# Patient Record
Sex: Male | Born: 1975 | Race: Black or African American | Hispanic: No | Marital: Single
Health system: Southern US, Community
[De-identification: ages and names within clinical notes are randomized; demographics above are authoritative.]

---

## 2009-05-02 ENCOUNTER — Inpatient Hospital Stay (HOSPITAL_COMMUNITY): Admission: EM | Admit: 2009-05-02 | Discharge: 2009-05-04 | Payer: Self-pay | Admitting: Emergency Medicine

## 2009-05-02 ENCOUNTER — Encounter: Payer: Self-pay | Admitting: Emergency Medicine

## 2009-05-04 ENCOUNTER — Emergency Department (HOSPITAL_COMMUNITY): Admission: EM | Admit: 2009-05-04 | Discharge: 2009-05-04 | Payer: Self-pay | Admitting: Emergency Medicine

## 2009-05-05 ENCOUNTER — Ambulatory Visit: Payer: Self-pay | Admitting: Vascular Surgery

## 2009-05-07 ENCOUNTER — Emergency Department (HOSPITAL_COMMUNITY): Admission: EM | Admit: 2009-05-07 | Discharge: 2009-05-07 | Payer: Self-pay | Admitting: Emergency Medicine

## 2009-05-16 ENCOUNTER — Emergency Department (HOSPITAL_COMMUNITY): Admission: EM | Admit: 2009-05-16 | Discharge: 2009-05-16 | Payer: Self-pay | Admitting: Emergency Medicine

## 2009-05-25 ENCOUNTER — Emergency Department (HOSPITAL_COMMUNITY): Admission: EM | Admit: 2009-05-25 | Discharge: 2009-05-25 | Payer: Self-pay | Admitting: Emergency Medicine

## 2009-06-08 ENCOUNTER — Encounter: Admission: RE | Admit: 2009-06-08 | Discharge: 2009-09-06 | Payer: Self-pay | Admitting: Orthopedic Surgery

## 2009-08-23 ENCOUNTER — Emergency Department (HOSPITAL_COMMUNITY): Admission: EM | Admit: 2009-08-23 | Discharge: 2009-08-23 | Payer: Self-pay | Admitting: Emergency Medicine

## 2009-09-13 ENCOUNTER — Emergency Department (HOSPITAL_COMMUNITY): Admission: EM | Admit: 2009-09-13 | Discharge: 2009-09-13 | Payer: Self-pay | Admitting: Emergency Medicine

## 2009-09-19 ENCOUNTER — Emergency Department (HOSPITAL_COMMUNITY): Admission: EM | Admit: 2009-09-19 | Discharge: 2009-09-19 | Payer: Self-pay | Admitting: Emergency Medicine

## 2009-09-25 ENCOUNTER — Emergency Department (HOSPITAL_COMMUNITY): Admission: EM | Admit: 2009-09-25 | Discharge: 2009-09-25 | Payer: Self-pay | Admitting: Emergency Medicine

## 2009-10-12 ENCOUNTER — Emergency Department (HOSPITAL_COMMUNITY): Admission: EM | Admit: 2009-10-12 | Discharge: 2009-10-12 | Payer: Self-pay | Admitting: Emergency Medicine

## 2009-11-13 ENCOUNTER — Emergency Department (HOSPITAL_COMMUNITY): Admission: EM | Admit: 2009-11-13 | Discharge: 2009-11-13 | Payer: Self-pay | Admitting: Emergency Medicine

## 2010-10-19 IMAGING — CR DG ABDOMEN 1V
1 series · 1 of 1 positions shown · non-contrast
Comparison: CT abdomen pelvis 09/13/2009. Chest radiograph
09/13/2009.

CLINICAL DATA: 33-year-old male with flank pain and history of
recent kidney stone.

ABDOMEN - 1 VIEW

[t abdomen supine]
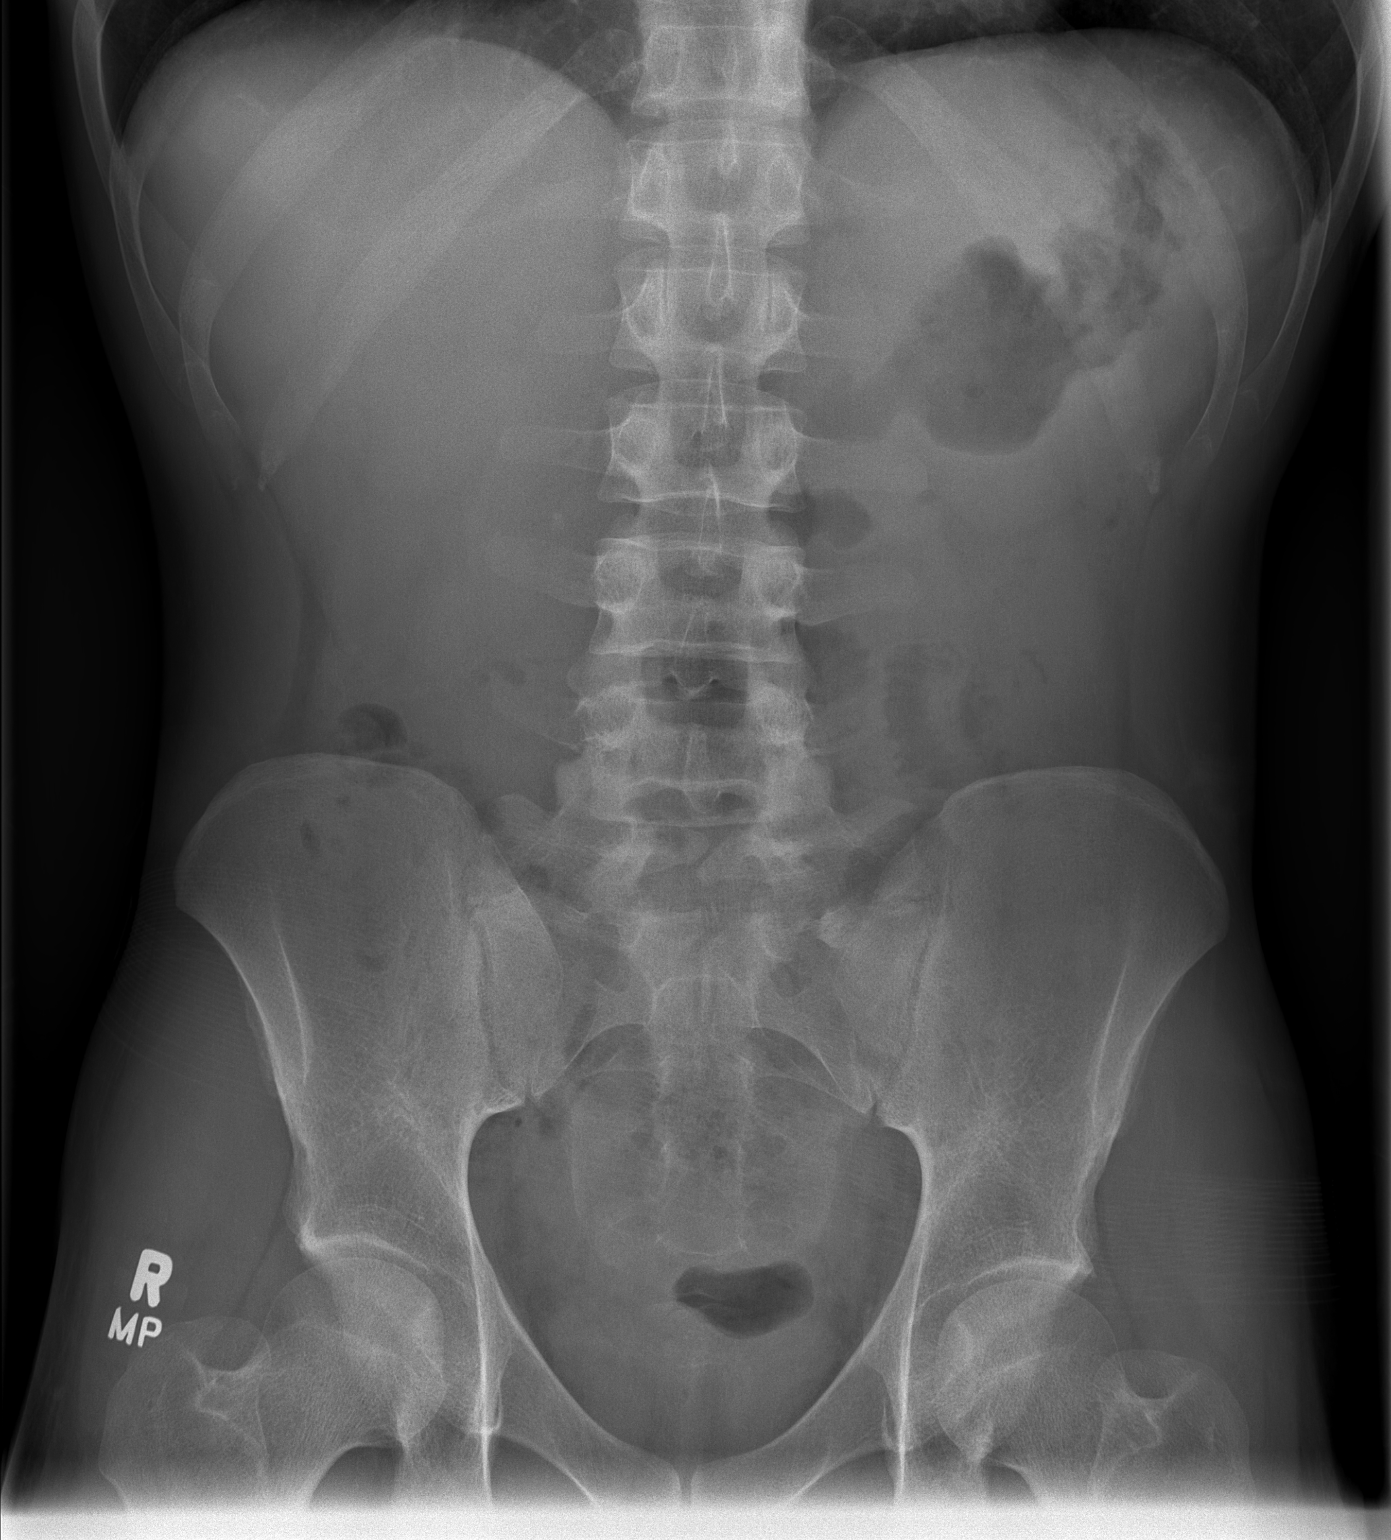

[1 of 1 positions shown; findings below may reference images not displayed]

FINDINGS: Hypoplastic twelfth ribs and partial sacralization of the
L5 vertebra. Incidental L5 spina bifida occulta. Stable visualized
osseous structures.  Visualized bowel gas pattern is nonobstructed.
Visceral contours within normal limits.

Unchanged position of the obstructing proximal right ureteral
calculus, projecting just above the right L3 transverse process.
IMPRESSION: Unchanged position of the previously seen proximal right ureteral
calculus, projecting at L2-L3.

## 2010-12-02 LAB — URINALYSIS, ROUTINE W REFLEX MICROSCOPIC
Bilirubin Urine: NEGATIVE
Bilirubin Urine: NEGATIVE
Glucose, UA: NEGATIVE mg/dL
Glucose, UA: NEGATIVE mg/dL
Hgb urine dipstick: NEGATIVE
Ketones, ur: NEGATIVE mg/dL
Leukocytes, UA: NEGATIVE
Nitrite: NEGATIVE
Nitrite: NEGATIVE
Protein, ur: NEGATIVE mg/dL
Specific Gravity, Urine: 1.005 (ref 1.005–1.030)
Specific Gravity, Urine: 1.016 (ref 1.005–1.030)
Urobilinogen, UA: 0.2 mg/dL (ref 0.0–1.0)
pH: 7 (ref 5.0–8.0)
pH: 7 (ref 5.0–8.0)

## 2010-12-02 LAB — BASIC METABOLIC PANEL WITH GFR
CO2: 28 meq/L (ref 19–32)
Calcium: 9.1 mg/dL (ref 8.4–10.5)
Creatinine, Ser: 0.91 mg/dL (ref 0.4–1.5)
GFR calc non Af Amer: >60 mL/min (ref >60)
Sodium: 142 meq/L (ref 135–145)

## 2010-12-02 LAB — BASIC METABOLIC PANEL
BUN: 10 mg/dL (ref 6–23)
Chloride: 108 mEq/L (ref 96–112)
GFR calc Af Amer: >60 mL/min (ref >60)
Glucose, Bld: 72 mg/dL (ref 70–99)
Potassium: 4 mEq/L (ref 3.5–5.1)

## 2010-12-02 LAB — URINE MICROSCOPIC-ADD ON

## 2010-12-05 LAB — DIFFERENTIAL
Lymphocytes Relative: 31 % (ref 12–46)
Lymphs Abs: 2.2 10*3/uL (ref 0.7–4.0)
Monocytes Absolute: 0.8 10*3/uL (ref 0.1–1.0)
Monocytes Relative: 11 % (ref 3–12)
Neutro Abs: 3.8 10*3/uL (ref 1.7–7.7)

## 2010-12-05 LAB — CBC
Hemoglobin: 13.3 g/dL (ref 13.0–17.0)
RDW: 13 % (ref 11.5–15.5)

## 2010-12-05 LAB — URINALYSIS, ROUTINE W REFLEX MICROSCOPIC
Bilirubin Urine: NEGATIVE
Glucose, UA: NEGATIVE mg/dL
Protein, ur: NEGATIVE mg/dL

## 2010-12-05 LAB — BASIC METABOLIC PANEL
BUN: 13 mg/dL (ref 6–23)
Calcium: 9.3 mg/dL (ref 8.4–10.5)
Creatinine, Ser: 1.32 mg/dL (ref 0.4–1.5)
GFR calc Af Amer: >60 mL/min (ref >60)
GFR calc non Af Amer: >60 mL/min (ref >60)

## 2010-12-13 IMAGING — CT CT ABD-PELV W/O CM
1 of 3 series · 16 of 32 positions shown, 20 images · non-contrast
Comparison: 09/13/2009

CLINICAL DATA: Right flank pain

CT ABDOMEN AND PELVIS WITHOUT CONTRAST
TECHNIQUE: Multidetector CT imaging of the abdomen and pelvis was
performed following the standard protocol without intravenous
contrast.

[Series 2: stone under 200# w/ prev · axial · 0.65mm/px · z∈[-449,-89]mm · 16 of 78 slices shown, 20 images]
[im 3/78  soft-tissue]
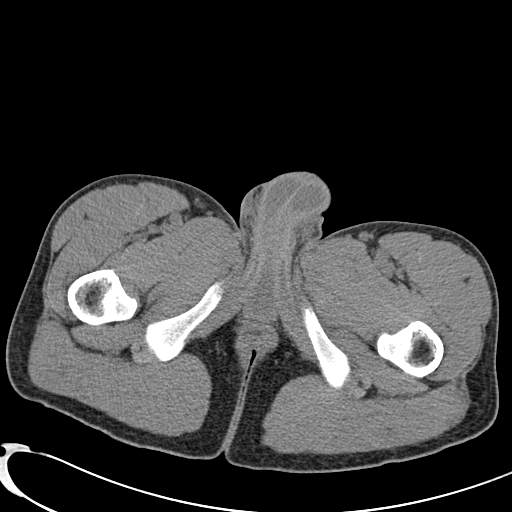
[im 3/78  bone]
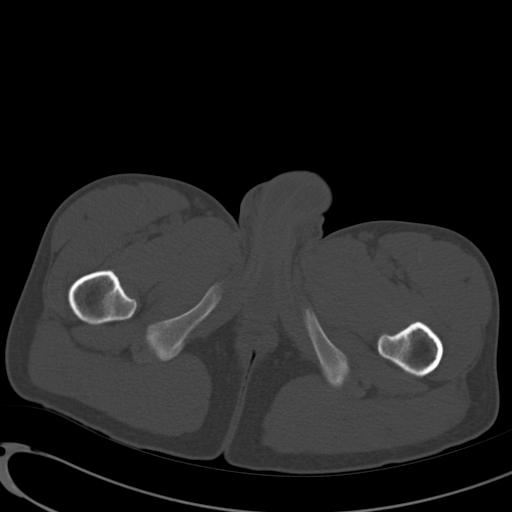
[im 9/78  soft-tissue]
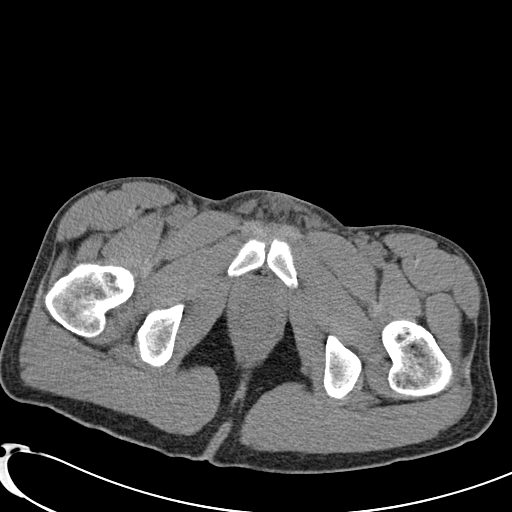
[im 15/78  soft-tissue]
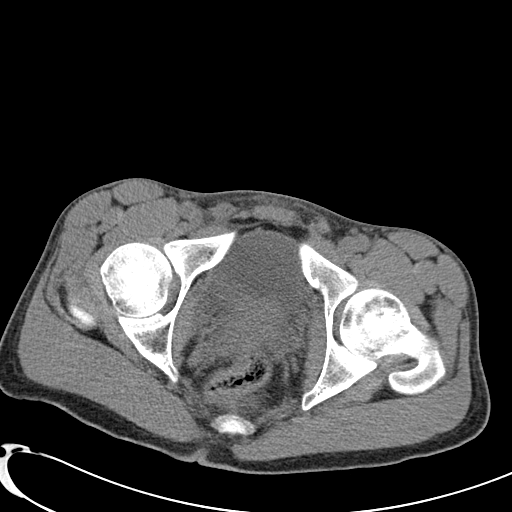
[im 21/78  soft-tissue]
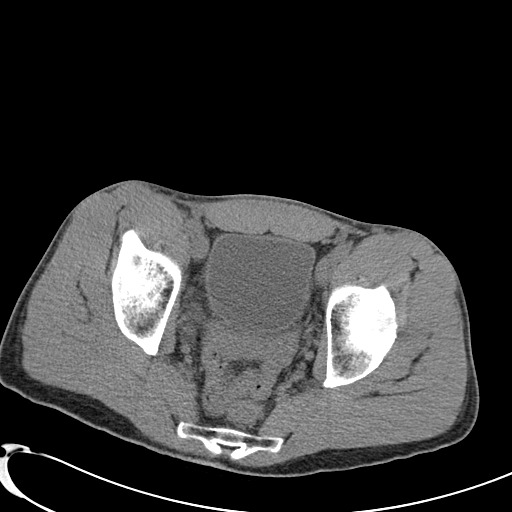
[im 27/78  soft-tissue]
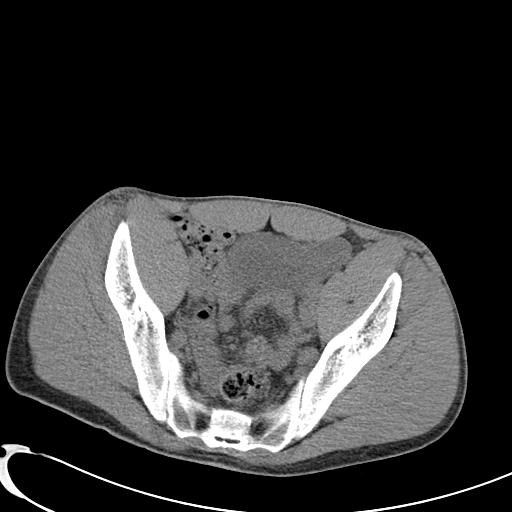
[im 30/78  soft-tissue]
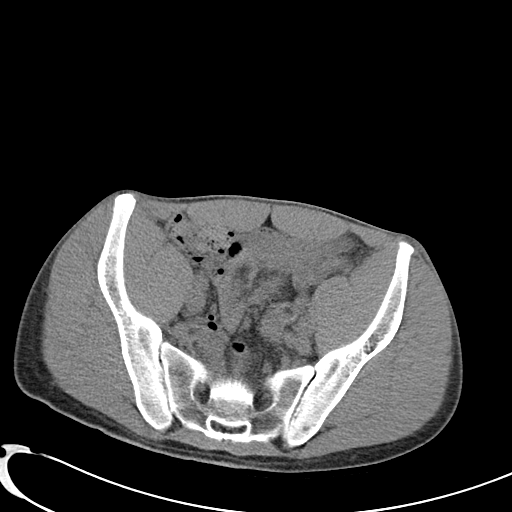
[im 36/78  soft-tissue]
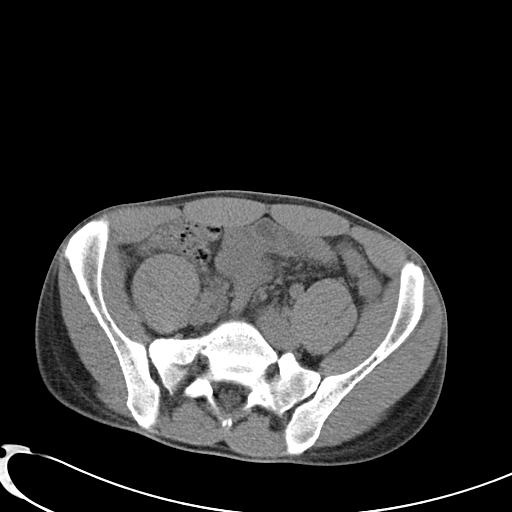
[im 42/78  soft-tissue]
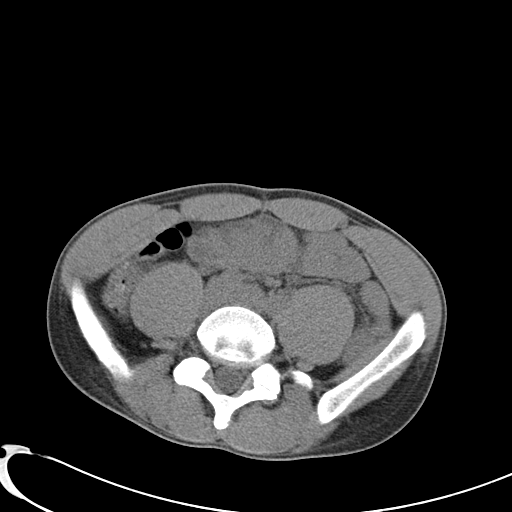
[im 48/78  soft-tissue]
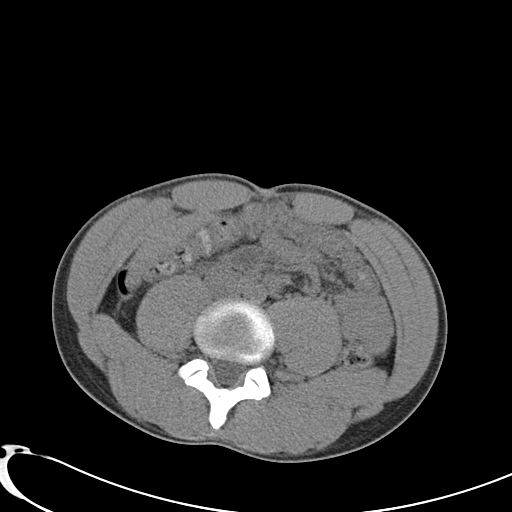
[im 48/78  bone]
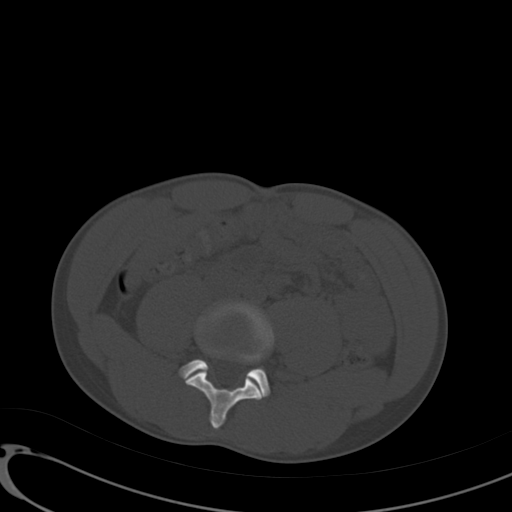
[im 51/78  soft-tissue]
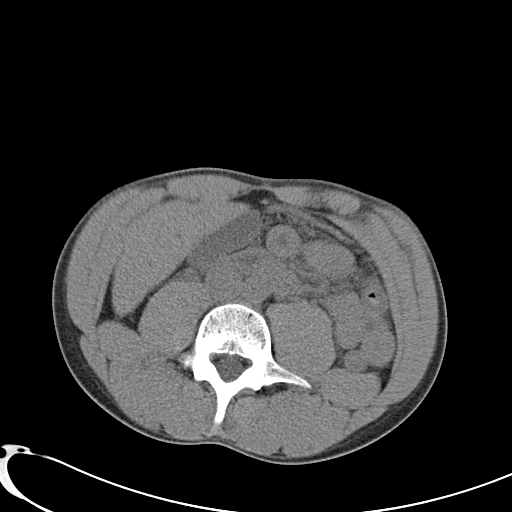
[im 57/78  soft-tissue]
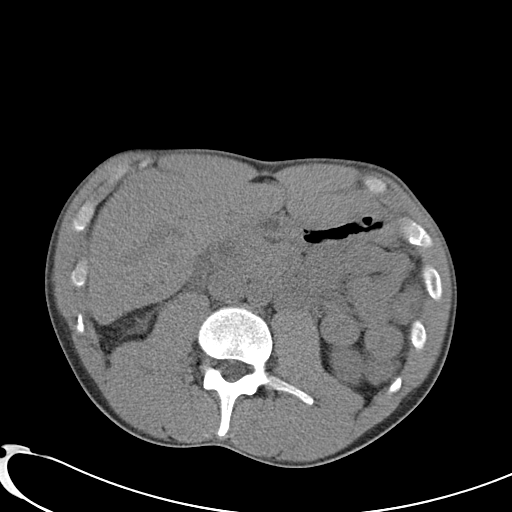
[im 63/78  soft-tissue]
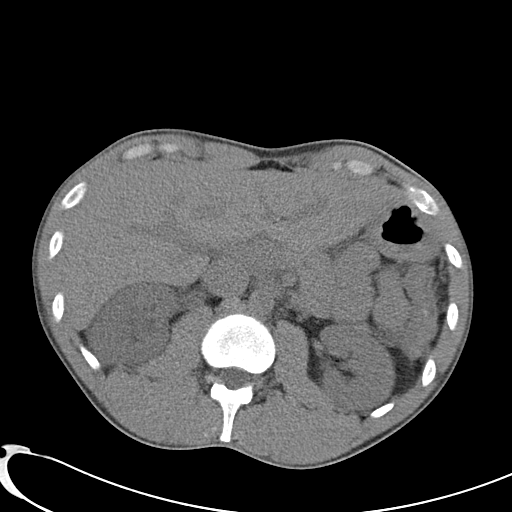
[im 66/78  lung]
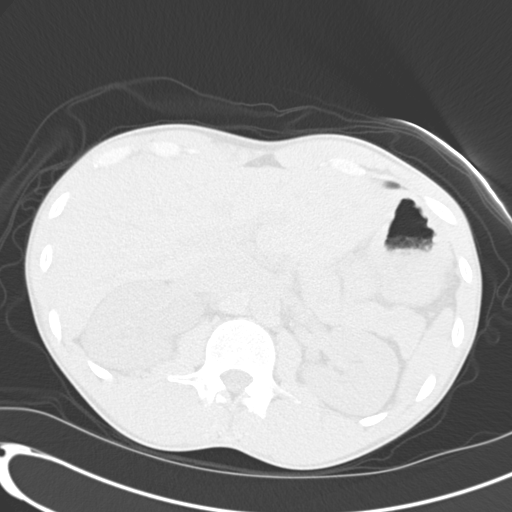
[im 69/78  soft-tissue]
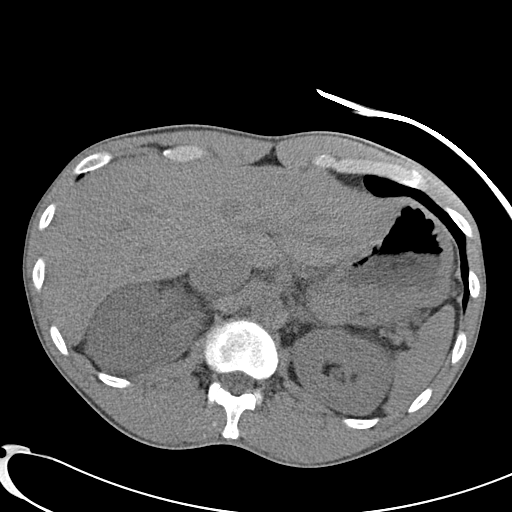
[im 69/78  lung]
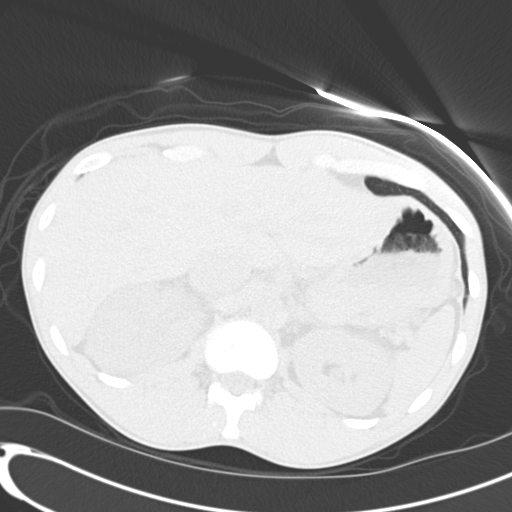
[im 72/78  lung]
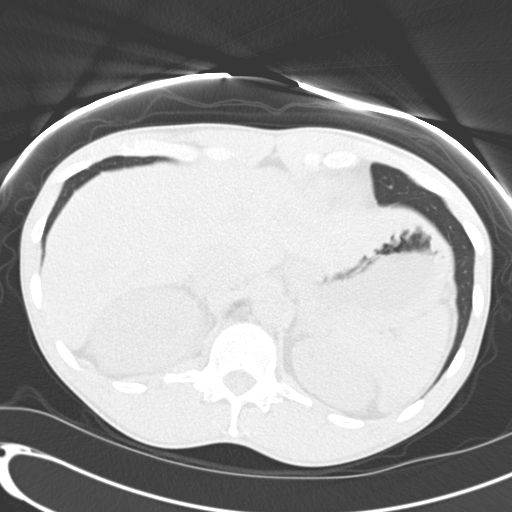
[im 75/78  soft-tissue]
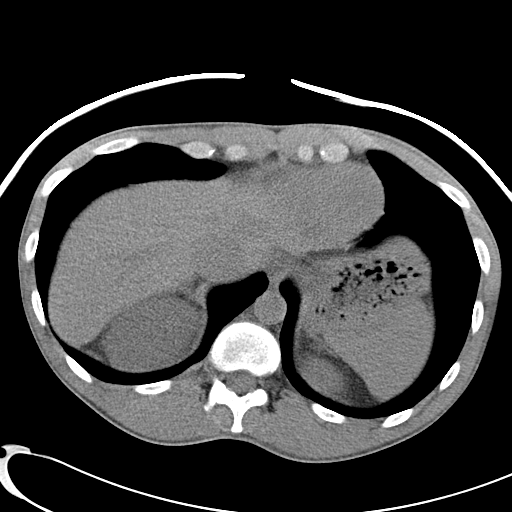
[im 75/78  lung]
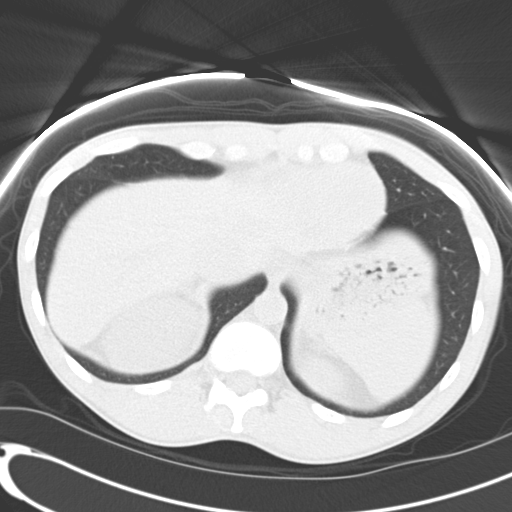

[16 of 32 positions shown; findings below may reference images not displayed]

FINDINGS: The lung bases are clear.  There is obstructive uropathy
on the right as before.  There is a 5.0 x 4.4 mm calculus at the
right UVJ, which has dropped down from the upper ureter.

Other abdominal viscera unremarkable in the unenhanced state.  No
adenopathy or ascites.  No masses or fluid collections.  Osseous
structures intact.
IMPRESSION: A 5.0 x 4.4 mm calculus noted on a prior study dated 09/13/2009,
has dropped from the upper ureter to the UVJ.  It is causing
moderate obstructive uropathy.

## 2010-12-17 LAB — URINALYSIS, ROUTINE W REFLEX MICROSCOPIC
Specific Gravity, Urine: 1.019 (ref 1.005–1.030)
Urobilinogen, UA: 1 mg/dL (ref 0.0–1.0)

## 2010-12-17 LAB — DIFFERENTIAL
Basophils Relative: 0 % (ref 0–1)
Lymphs Abs: 1.9 10*3/uL (ref 0.7–4.0)
Monocytes Relative: 8 % (ref 3–12)
Neutro Abs: 3.1 10*3/uL (ref 1.7–7.7)
Neutrophils Relative %: 55 % (ref 43–77)

## 2010-12-17 LAB — COMPREHENSIVE METABOLIC PANEL
Alkaline Phosphatase: 71 U/L (ref 39–117)
BUN: 8 mg/dL (ref 6–23)
Calcium: 9.4 mg/dL (ref 8.4–10.5)
GFR calc non Af Amer: >60 mL/min (ref >60)
Glucose, Bld: 95 mg/dL (ref 70–99)
Total Protein: 7.1 g/dL (ref 6.0–8.3)

## 2010-12-17 LAB — CBC
HCT: 38.1 % — ABNORMAL LOW (ref 39.0–52.0)
Hemoglobin: 13 g/dL (ref 13.0–17.0)
MCHC: 34.1 g/dL (ref 30.0–36.0)
MCV: 91.1 fL (ref 78.0–100.0)
RDW: 13 % (ref 11.5–15.5)

## 2010-12-17 LAB — URINE MICROSCOPIC-ADD ON

## 2010-12-22 LAB — CROSSMATCH
ABO/RH(D): B POS
Antibody Screen: NEGATIVE

## 2010-12-22 LAB — CBC
HCT: 28.1 % — ABNORMAL LOW (ref 39.0–52.0)
HCT: 33.2 % — ABNORMAL LOW (ref 39.0–52.0)
HCT: 37.2 % — ABNORMAL LOW (ref 39.0–52.0)
Hemoglobin: 10 g/dL — ABNORMAL LOW (ref 13.0–17.0)
Hemoglobin: 10.1 g/dL — ABNORMAL LOW (ref 13.0–17.0)
MCHC: 33.7 g/dL (ref 30.0–36.0)
MCHC: 35.7 g/dL (ref 30.0–36.0)
MCV: 93.6 fL (ref 78.0–100.0)
MCV: 93.7 fL (ref 78.0–100.0)
MCV: 93.9 fL (ref 78.0–100.0)
Platelets: 150 10*3/uL (ref 150–400)
Platelets: 178 10*3/uL (ref 150–400)
RBC: 3.01 MIL/uL — ABNORMAL LOW (ref 4.22–5.81)
RBC: 3.14 MIL/uL — ABNORMAL LOW (ref 4.22–5.81)
RBC: 3.54 MIL/uL — ABNORMAL LOW (ref 4.22–5.81)
RDW: 12.3 % (ref 11.5–15.5)
WBC: 13.5 10*3/uL — ABNORMAL HIGH (ref 4.0–10.5)
WBC: 15.4 10*3/uL — ABNORMAL HIGH (ref 4.0–10.5)
WBC: 9.2 10*3/uL (ref 4.0–10.5)

## 2010-12-22 LAB — DIFFERENTIAL
Eosinophils Absolute: 0 10*3/uL (ref 0.0–0.7)
Eosinophils Relative: 0 % (ref 0–5)
Lymphocytes Relative: 8 % — ABNORMAL LOW (ref 12–46)
Lymphs Abs: 1.3 10*3/uL (ref 0.7–4.0)
Monocytes Relative: 7 % (ref 3–12)

## 2010-12-22 LAB — TYPE AND SCREEN

## 2010-12-22 LAB — RAPID URINE DRUG SCREEN, HOSP PERFORMED
Amphetamines: NOT DETECTED
Tetrahydrocannabinol: POSITIVE — AB

## 2010-12-22 LAB — BASIC METABOLIC PANEL
CO2: 27 mEq/L (ref 19–32)
Calcium: 8.9 mg/dL (ref 8.4–10.5)
Chloride: 113 mEq/L — ABNORMAL HIGH (ref 96–112)
Creatinine, Ser: 0.91 mg/dL (ref 0.4–1.5)
GFR calc Af Amer: >60 mL/min (ref >60)
GFR calc Af Amer: >60 mL/min (ref >60)
GFR calc non Af Amer: >60 mL/min (ref >60)
Potassium: 3.7 mEq/L (ref 3.5–5.1)
Sodium: 140 mEq/L (ref 135–145)
Sodium: 140 mEq/L (ref 135–145)

## 2010-12-22 LAB — APTT: aPTT: 28 seconds (ref 24–37)

## 2010-12-22 LAB — ABO/RH: ABO/RH(D): B POS
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
## 2016-10-21 ENCOUNTER — Telehealth: Payer: Self-pay

## 2016-10-21 NOTE — Telephone Encounter (Signed)
ENCOUNTER OPENED IN ERROR

## 2018-07-23 ENCOUNTER — Ambulatory Visit: Payer: Self-pay | Admitting: General Surgery
